# Patient Record
Sex: Male | Born: 1985 | Hispanic: Yes | Marital: Married
Health system: Southern US, Community
[De-identification: ages and names within clinical notes are randomized; demographics above are authoritative.]

---

## 2019-06-04 ENCOUNTER — Other Ambulatory Visit: Payer: Self-pay

## 2019-06-04 DIAGNOSIS — Z20822 Contact with and (suspected) exposure to covid-19: Secondary | ICD-10-CM

## 2019-06-05 LAB — NOVEL CORONAVIRUS, NAA: SARS-CoV-2, NAA: DETECTED — AB

## 2019-06-13 ENCOUNTER — Telehealth: Payer: Self-pay

## 2019-06-13 NOTE — Telephone Encounter (Signed)
Per pt. Request, faxed COVID 19 test results to his lawyer: [employer]; fax # [phone].

## 2019-06-13 NOTE — Telephone Encounter (Signed)
Patient called back and said that he needs his covid test send to fax # [phone].

## 2019-06-13 NOTE — Telephone Encounter (Signed)
Made 3 attempts to fax pt's COVID results to [place] @ [phone].  Call placed to Law office to verify the fax number is correct.  Per Secretary, the fax number is correct.  Noted that pt. Returned call to request his COVID test be sent to a different # ; [phone].  Phone call to pt. To inquire about the name of business that this is to be faxed to.  Pt. Stated that it is another number at the law office of [place].    Fax was successful @ [phone] @ 10:45 AM.  Pt. Was made aware per phone.

## 2019-10-01 DIAGNOSIS — F1510 Other stimulant abuse, uncomplicated: Secondary | ICD-10-CM | POA: Insufficient documentation

## 2019-10-01 DIAGNOSIS — R002 Palpitations: Secondary | ICD-10-CM | POA: Insufficient documentation

## 2019-10-02 ENCOUNTER — Other Ambulatory Visit: Payer: Self-pay

## 2019-10-02 ENCOUNTER — Emergency Department (HOSPITAL_COMMUNITY)
Admission: EM | Admit: 2019-10-02 | Discharge: 2019-10-02 | Disposition: A | Discharge status: Home / Self Care | Payer: Self-pay | Attending: Emergency Medicine | Admitting: Emergency Medicine

## 2019-10-02 ENCOUNTER — Telehealth: Payer: Self-pay

## 2019-10-02 ENCOUNTER — Emergency Department (HOSPITAL_COMMUNITY): Payer: Self-pay

## 2019-10-02 DIAGNOSIS — R002 Palpitations: Secondary | ICD-10-CM

## 2019-10-02 DIAGNOSIS — F1510 Other stimulant abuse, uncomplicated: Secondary | ICD-10-CM

## 2019-10-02 LAB — BASIC METABOLIC PANEL
Anion gap: 12 (ref 5–15)
BUN: 19 mg/dL (ref 6–20)
CO2: 24 mmol/L (ref 22–32)
Calcium: 9.9 mg/dL (ref 8.9–10.3)
Chloride: 100 mmol/L (ref 98–111)
Creatinine, Ser: 1.11 mg/dL (ref 0.61–1.24)
GFR calc Af Amer: >60 mL/min (ref >60)
GFR calc non Af Amer: >60 mL/min (ref >60)
Glucose, Bld: 112 mg/dL — ABNORMAL HIGH (ref 70–99)
Potassium: 3.9 mmol/L (ref 3.5–5.1)
Sodium: 136 mmol/L (ref 135–145)

## 2019-10-02 LAB — TROPONIN I (HIGH SENSITIVITY)
Troponin I (High Sensitivity): 5 ng/L (ref <18)
Troponin I (High Sensitivity): 6 ng/L (ref <18)

## 2019-10-02 LAB — CBC
HCT: 50.4 % (ref 39.0–52.0)
Hemoglobin: 17.3 g/dL — ABNORMAL HIGH (ref 13.0–17.0)
MCH: 29.5 pg (ref 26.0–34.0)
MCHC: 34.3 g/dL (ref 30.0–36.0)
MCV: 85.9 fL (ref 80.0–100.0)
Platelets: 419 10*3/uL — ABNORMAL HIGH (ref 150–400)
RBC: 5.87 MIL/uL — ABNORMAL HIGH (ref 4.22–5.81)
RDW: 12 % (ref 11.5–15.5)
WBC: 8.4 10*3/uL (ref 4.0–10.5)
nRBC: 0 % (ref 0.0–0.2)

## 2019-10-02 MED ORDER — LORAZEPAM 1 MG PO TABS
1.0000 mg | ORAL_TABLET | Freq: Once | ORAL | Status: AC
  Administered 2019-10-02: 1 mg via ORAL
  Filled 2019-10-02: qty 1

## 2019-10-02 NOTE — ED Triage Notes (Signed)
Pt states that he overdosed on meth last night and is having CP.

## 2019-10-02 NOTE — Discharge Instructions (Signed)
Your work-up in the emergency department today was reassuring.  We recommend that you discontinue use of illicit drugs.  You may follow-up with a treatment facility, if desired.  Return for any new or concerning symptoms.

## 2019-10-02 NOTE — Telephone Encounter (Signed)
Spoke to Pts sister via phone at [phone] to discuss avenues for detox. Sister informed CSW that Pt is currently at [facility] receiving detox services due to acute need.

## 2019-10-02 NOTE — ED Provider Notes (Signed)
[facility] EMERGENCY DEPARTMENT Provider Note   CSN: [id number] Arrival date & time: 10/01/19  2358     History Chief Complaint  Patient presents with  . Chest Pain    Duff Pozzi is a 34 y.o. male.  34 year old male presents to the emergency department for evaluation of chest discomfort.  Upon further inquiry into the patient's symptoms, it seems as though his complaint is more related to palpitations.  States that he felt his heart beating rapidly prior to arrival.  Has not been able to sleep for the past 48 hours.  Started a meth binge on Friday and thinks he may have overdosed on methamphetamines.  States that his palpitations have been spontaneously improving since arrival in the ED.  No fevers, syncope, vomiting, hemoptysis, leg swelling. Denies known FHx of sudden cardiac death. Does have remote hx of cocaine use, but has not used this x 1 month.  The history is provided by the patient. No language interpreter was used.  Chest Pain      No past medical history on file.  There are no problems to display for this patient.   ** The histories are not reviewed yet. Please review them in the "History" navigator section and refresh this SmartLink.     No family history on file.  Social History   Tobacco Use  . Smoking status: Not on file  Substance Use Topics  . Alcohol use: Not on file  . Drug use: Not on file    Home Medications Prior to Admission medications   Not on File    Allergies    Patient has no known allergies.  Review of Systems   Review of Systems  Cardiovascular: Positive for chest pain.  Ten systems reviewed and are negative for acute change, except as noted in the HPI.    Physical Exam Updated Vital Signs BP (!) 118/93   Pulse 96   Temp 98.1 F (36.7 C)   Resp 16   SpO2 100%   Physical Exam Vitals and nursing note reviewed.  Constitutional:      General: He is not in acute distress.    Appearance: He is  well-developed. He is not diaphoretic.     Comments: Nontoxic-appearing and in no distress  HENT:     Head: Normocephalic and atraumatic.  Eyes:     General: No scleral icterus.    Conjunctiva/sclera: Conjunctivae normal.  Cardiovascular:     Rate and Rhythm: Normal rate and regular rhythm.     Pulses: Normal pulses.  Pulmonary:     Effort: Pulmonary effort is normal. No respiratory distress.     Breath sounds: No stridor. No wheezing or rales.     Comments: Lungs clear to auscultation bilaterally.  Respirations even and unlabored. Musculoskeletal:        General: Normal range of motion.     Cervical back: Normal range of motion.  Skin:    General: Skin is warm and dry.     Coloration: Skin is not pale.     Findings: No erythema or rash.  Neurological:     Mental Status: He is alert and oriented to person, place, and time.  Psychiatric:        Mood and Affect: Mood is anxious.        Behavior: Behavior normal.     ED Results / Procedures / Treatments   Labs (all labs ordered are listed, but only abnormal results are displayed) Labs Reviewed  BASIC METABOLIC PANEL - Abnormal; Notable for the following components:      Result Value   Glucose, Bld 112 (*)    All other components within normal limits  CBC - Abnormal; Notable for the following components:   RBC 5.87 (*)    Hemoglobin 17.3 (*)    Platelets 419 (*)    All other components within normal limits  TROPONIN I (HIGH SENSITIVITY)  TROPONIN I (HIGH SENSITIVITY)    EKG EKG Interpretation  Date/Time:  Tuesday October 02 2019 00:03:05 EST Ventricular Rate:  98 PR Interval:  130 QRS Duration: 72 QT Interval:  322 QTC Calculation: 411 R Axis:   78 Text Interpretation: Normal sinus rhythm Biatrial enlargement Nonspecific T wave abnormality Abnormal ECG No old tracing to compare Confirmed by Deno Etienne [phone]) on 10/02/2019 4:08:03 AM   Radiology DG Chest 2 View  Result Date: 10/02/2019 CLINICAL DATA:  Chest  pain EXAM: CHEST - 2 VIEW COMPARISON:  None. FINDINGS: The heart size and mediastinal contours are within normal limits. Both lungs are clear. The visualized skeletal structures are unremarkable. IMPRESSION: No active cardiopulmonary disease. Electronically Signed   By: Ulyses Jarred M.D.   On: 10/02/2019 00:28    Procedures Procedures (including critical care time)  Medications Ordered in ED Medications  LORazepam (ATIVAN) tablet 1 mg (1 mg Oral Given 10/02/19 0455)    ED Course  I have reviewed the triage vital signs and the nursing notes.  Pertinent labs & imaging results that were available during my care of the patient were reviewed by me and considered in my medical decision making (see chart for details).    MDM Rules/Calculators/A&P                      34 year old male presents to the emergency department for evaluation of palpitations associated with methamphetamine use.  Has been on a meth binge since Friday.  Has also not been sleeping.  Had borderline tachycardia on arrival which has been spontaneously improving.  Patient also notes spontaneous cessation of his palpitations while in the waiting room.  His troponin is negative x2.  Chest x-ray without acute cardiopulmonary abnormality.  Nonspecific EKG changes, but no findings concerning for acute ischemia.  Suspect his symptoms to be secondary to illicit drug use.  Will provide resource guide should patient desire follow-up in an outpatient treatment facility.  Return precautions discussed and provided. Patient discharged in stable condition with no unaddressed concerns.   Final Clinical Impression(s) / ED Diagnoses Final diagnoses:  Palpitations  Methamphetamine abuse [facility])    Rx / DC Orders ED Discharge Orders    None       Antonietta Breach, PA-C 10/02/19 [place], [place], DO 10/02/19 [phone]

## 2019-10-03 ENCOUNTER — Ambulatory Visit (HOSPITAL_COMMUNITY)
Admission: RE | Admit: 2019-10-03 | Discharge: 2019-10-03 | Disposition: A | Discharge status: Home / Self Care | Payer: Self-pay | Attending: Psychiatry | Admitting: Psychiatry

## 2019-10-03 ENCOUNTER — Encounter (HOSPITAL_COMMUNITY): Payer: Self-pay | Admitting: Psychiatry

## 2019-10-03 DIAGNOSIS — Z7289 Other problems related to lifestyle: Secondary | ICD-10-CM | POA: Insufficient documentation

## 2019-10-03 NOTE — H&P (Signed)
Behavioral Health Medical Screening Exam  Patrick Page is an 34 y.o. male.  Patient presents voluntarily to [facility] behavioral health for walk-in assessment, patient accompanied by his sister.  Patient alert and oriented, answers appropriately.  Patient states he has been using alcohol and cocaine.  Patient reports he was clean "and sober for a long time but I recently started using again."  Patient reports he is here today because he would like resources for where he could get help with his substance use disorder as he is currently uninsured.  Patient denies suicidal and homicidal ideations.  Patient denies history of self-harm.  Patient denies auditory and visual hallucinations.  Patient denies history of seizures and delirium tremens. Patient reports he lives at home with his wife and sister.  Patient denies weapons in the home.  Patient reports he is employed in [employer].  Patient denies having outpatient psychiatrist, denies history of mental health diagnoses. Case discussed with Dr. Lucianne Muss.  Patient does not meet inpatient criteria at this time.  Total Time spent with patient: 30 minutes  Psychiatric Specialty Exam: Physical Exam  Nursing note and vitals reviewed. Constitutional: He is oriented to person, place, and time. He appears well-developed.  HENT:  Head: Normocephalic.  Cardiovascular: Normal rate.  Respiratory: Effort normal.  Neurological: He is alert and oriented to person, place, and time.  Psychiatric: He has a normal mood and affect. His behavior is normal. Judgment and thought content normal.    Review of Systems  Constitutional: Negative.   HENT: Negative.   Eyes: Negative.   Respiratory: Negative.   Cardiovascular: Negative.   Gastrointestinal: Negative.   Genitourinary: Negative.   Musculoskeletal: Negative.   Skin: Negative.   Neurological: Negative.     Blood pressure 137/77, pulse 92, temperature 98.2 F (36.8 C), temperature source Oral, resp. rate 18, SpO2  99 %.There is no height or weight on file to calculate BMI.  General Appearance: Casual and Fairly Groomed  Eye Contact:  Good  Speech:  Clear and Coherent and Normal Rate  Volume:  Normal  Mood:  Euthymic  Affect:  Appropriate and Congruent  Thought Process:  Coherent, Goal Directed and Descriptions of Associations: Intact  Orientation:  Full (Time, Place, and Person)  Thought Content:  WDL and Logical  Suicidal Thoughts:  No  Homicidal Thoughts:  No  Memory:  Immediate;   Good Recent;   Good Remote;   Good  Judgement:  Good  Insight:  Good  Psychomotor Activity:  Normal  Concentration: Concentration: Good and Attention Span: Good  Recall:  Good  Fund of Knowledge:Good  Language: Good  Akathisia:  No  Handed:  Right  AIMS (if indicated):     Assets:  Communication Skills Desire for Improvement Financial Resources/Insurance Housing Intimacy Leisure Time Physical Health Resilience Social Support Talents/Skills Transportation  Sleep:       Musculoskeletal: Strength & Muscle Tone: within normal limits Gait & Station: normal Patient leans: N/A  Blood pressure 137/77, pulse 92, temperature 98.2 F (36.8 C), temperature source Oral, resp. rate 18, SpO2 99 %.  Recommendations:  Based on my evaluation the patient does not appear to have an emergency medical condition.  Patrcia Dolly, FNP 10/03/2019, 1:39 PM

## 2019-10-03 NOTE — BH Assessment (Signed)
Assessment Note  Patrick Page is an 34 y.o. male who presented to [facility] as a walk-in seeking help for a drug problem.  Patient states that he has a history of cocaine use, but states that he has not used cocaine "in a long time."  He admits to drinking 3-4 beers each day of the weekend.  Patient states that he used methamphetamine over the weekend, 2-3 grams, and his wife is upset with him.  Patient states that he has been trying to get services for his use, but he has not been successful.  Patient states, "I don't want to be locked up anywhere."  He states that he is looking for an outpatient program that will provide him with counseling and drug tests.  Patient denies having any mental health history and states that he has never been suicidal, homicidal or psychotic.  He states that he has no history of abuse or self-mutilation.  He states that his sleep and appetite are good and he is able to contract for safety.  Patient presents as alert and oriented, his thoughts are organized and his memory is intact.  He does not appear to be responding to any internal stimuli.  His judgment, insight and impulse control are partially impaired due to his drug and alcohol use.   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   Family History: No family history on file.  Social History:  reports current alcohol use. He reports current drug use. Drug: Amphetamines. No history on file for tobacco.  Additional Social History:  Alcohol / Drug Use Pain Medications: see MAR Prescriptions: see MAR Over the Counter: see MAR History of alcohol / drug use?: Yes Longest period of sobriety (when/how long): UTA Negative Consequences of Use: Personal relationships Substance #1 Name of Substance 1: methamphetamine 1 - Age of First Use: 33 1 - Amount (size/oz): 2-3 grams 1 - Frequency: 1 time 1 - Duration: over the weekend 1 - Last Use / Amount: 3 days  ago Substance #2 Name of Substance 2: alcohol 2 - Age of First Use: UTA 2 - Amount (size/oz): 3-4 beers 2 - Frequency: 3 days week 2 - Duration: since onset 2 - Last Use / Amount: 3-4 beers  CIWA: CIWA-Ar BP: 137/77 Pulse Rate: 92 COWS:    Allergies: No Known Allergies  Home Medications: (Not in a hospital admission)   OB/GYN Status:  No LMP for male patient.  General Assessment Data Location of Assessment: [facility] Services TTS Assessment: In system Is this a Tele or Face-to-Face Assessment?: Face-to-Face Is this an Initial Assessment or a Re-assessment for this encounter?: Initial Assessment Patient Accompanied by:: Adult Permission Given to speak with another: No Language Other than English: No Living Arrangements: Other (Comment)(with wife) What gender do you identify as?: Male Marital status: Married Living Arrangements: Spouse/significant other Can pt return to current living arrangement?: Yes Admission Status: Voluntary Is patient capable of signing voluntary admission?: Yes Referral Source: Self/Family/Friend Insurance type: self-pay  Medical Screening Exam [facility] Walk-in ONLY) Medical Exam completed: Yes  Crisis Care Plan Living Arrangements: Spouse/significant other Legal Guardian: Other:(self) Name of Psychiatrist: none Name of Therapist: none  Education Status Is patient currently in school?: No Is the patient employed, unemployed or receiving disability?: Employed  Risk to self with the past 6 months Suicidal Ideation: No Has patient been a risk to self within the past 6 months prior to admission? : No Suicidal Intent: No  Has patient had any suicidal intent within the past 6 months prior to admission? : No Is patient at risk for suicide?: No Suicidal Plan?: No Has patient had any suicidal plan within the past 6 months prior to admission? : No Access to Means: No What has been your use of drugs/alcohol within the last 12 months?: meth and  alcohol Previous Attempts/Gestures: No How many times?: 0 Other Self Harm Risks: drug use Triggers for Past Attempts: None known Intentional Self Injurious Behavior: None Family Suicide History: No Recent stressful life event(s): Conflict (Comment)(with wife) Persecutory voices/beliefs?: No Depression: No Depression Symptoms: (none) Substance abuse history and/or treatment for substance abuse?: No Suicide prevention information given to non-admitted patients: Not applicable  Risk to Others within the past 6 months Homicidal Ideation: No Does patient have any lifetime risk of violence toward others beyond the six months prior to admission? : No Thoughts of Harm to Others: No Current Homicidal Intent: No Current Homicidal Plan: No Access to Homicidal Means: No Identified Victim: none History of harm to others?: No Assessment of Violence: None Noted Violent Behavior Description: none Does patient have access to weapons?: No Criminal Charges Pending?: No Does patient have a court date: No Is patient on probation?: No  Psychosis Hallucinations: None noted Delusions: None noted  Mental Status Report Appearance/Hygiene: Unremarkable Eye Contact: Good Motor Activity: Restlessness Speech: Unremarkable Level of Consciousness: Alert Mood: Pleasant Affect: Appropriate to circumstance Anxiety Level: Moderate Thought Processes: Coherent, Relevant Judgement: Partial Orientation: Person, Place, Time, Situation Obsessive Compulsive Thoughts/Behaviors: None  Cognitive Functioning Concentration: Normal Memory: Recent Intact, Remote Intact Is patient IDD: No Insight: Fair Impulse Control: Poor Appetite: Good Have you had any weight changes? : No Change Sleep: No Change Total Hours of Sleep: 8 Vegetative Symptoms: None  ADLScreening [facility] Assessment Services) Patient's cognitive ability adequate to safely complete daily activities?: Yes Patient able to express need for  assistance with ADLs?: Yes Independently performs ADLs?: Yes (appropriate for developmental age)  Prior Inpatient Therapy Prior Inpatient Therapy: No  Prior Outpatient Therapy Prior Outpatient Therapy: No Does patient have an ACCT team?: No Does patient have Intensive In-House Services?  : No Does patient have [facility] services? : No Does patient have P4CC services?: No  ADL Screening (condition at time of admission) Patient's cognitive ability adequate to safely complete daily activities?: Yes Is the patient deaf or have difficulty hearing?: No Does the patient have difficulty seeing, even when wearing glasses/contacts?: No Does the patient have difficulty concentrating, remembering, or making decisions?: No Patient able to express need for assistance with ADLs?: Yes Does the patient have difficulty dressing or bathing?: No Independently performs ADLs?: Yes (appropriate for developmental age) Does the patient have difficulty walking or climbing stairs?: No Weakness of Legs: None Weakness of Arms/Hands: None  Home Assistive Devices/Equipment Home Assistive Devices/Equipment: None  Therapy Consults (therapy consults require a physician order) PT Evaluation Needed: No OT Evalulation Needed: No SLP Evaluation Needed: No Abuse/Neglect Assessment (Assessment to be complete while patient is alone) Abuse/Neglect Assessment Can Be Completed: Yes Physical Abuse: Denies Verbal Abuse: Denies Sexual Abuse: Denies Exploitation of patient/patient's resources: Denies Values / Beliefs Cultural Requests During Hospitalization: None Spiritual Requests During Hospitalization: None Consults Spiritual Care Consult Needed: No Transition of Care Team Consult Needed: No Advance Directives (For Healthcare) Does Patient Have a Medical Advance Directive?: No Would patient like information on creating a medical advance directive?: No - Patient declined Nutrition Screen- [facility] Adult/[facility]/AP Has the  patient recently lost weight  without trying?: No Has the patient been eating poorly because of a decreased appetite?: No Malnutrition Screening Tool Score: 0        Disposition: Per Letitia Libra, NP, patient does not meet inpatient admission criteria and was given outpatient drug treatment services referrals to [facility] Disposition Initial Assessment Completed for this Encounter: Yes Disposition of Patient: Discharge Patient refused recommended treatment: No Mode of transportation if patient is discharged/movement?: Car Patient referred to: [facility]  On Site Evaluation by:   Reviewed with Physician:    Judeth Porch Nakyiah Kuck 10/03/2019 1:52 PM

## 2021-06-15 IMAGING — CR DG CHEST 2V
2 series · 2 of 2 positions shown · non-contrast
Comparison: None.

CLINICAL DATA: Chest pain

EXAM:
CHEST - 2 VIEW

[chest pa]
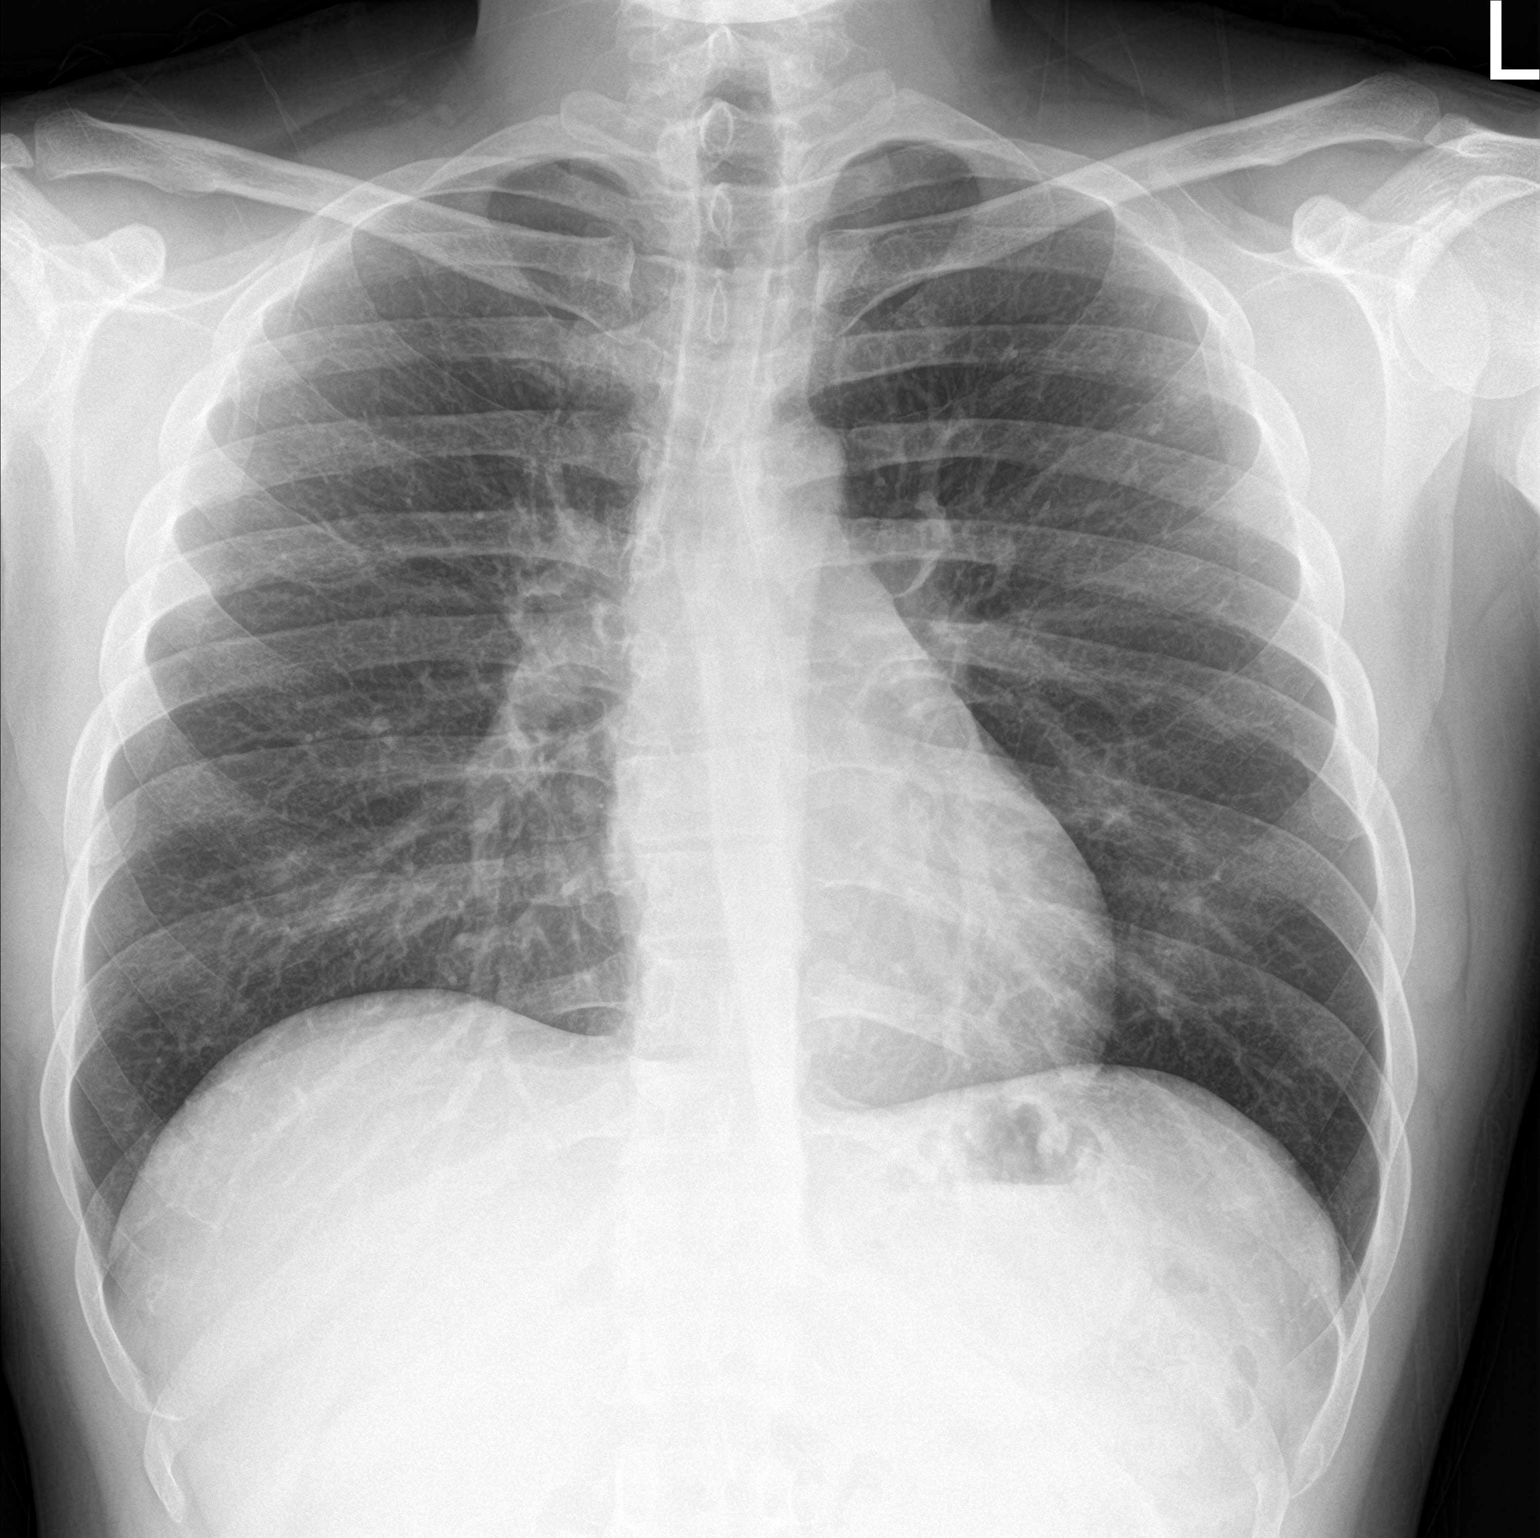

[chest lat]
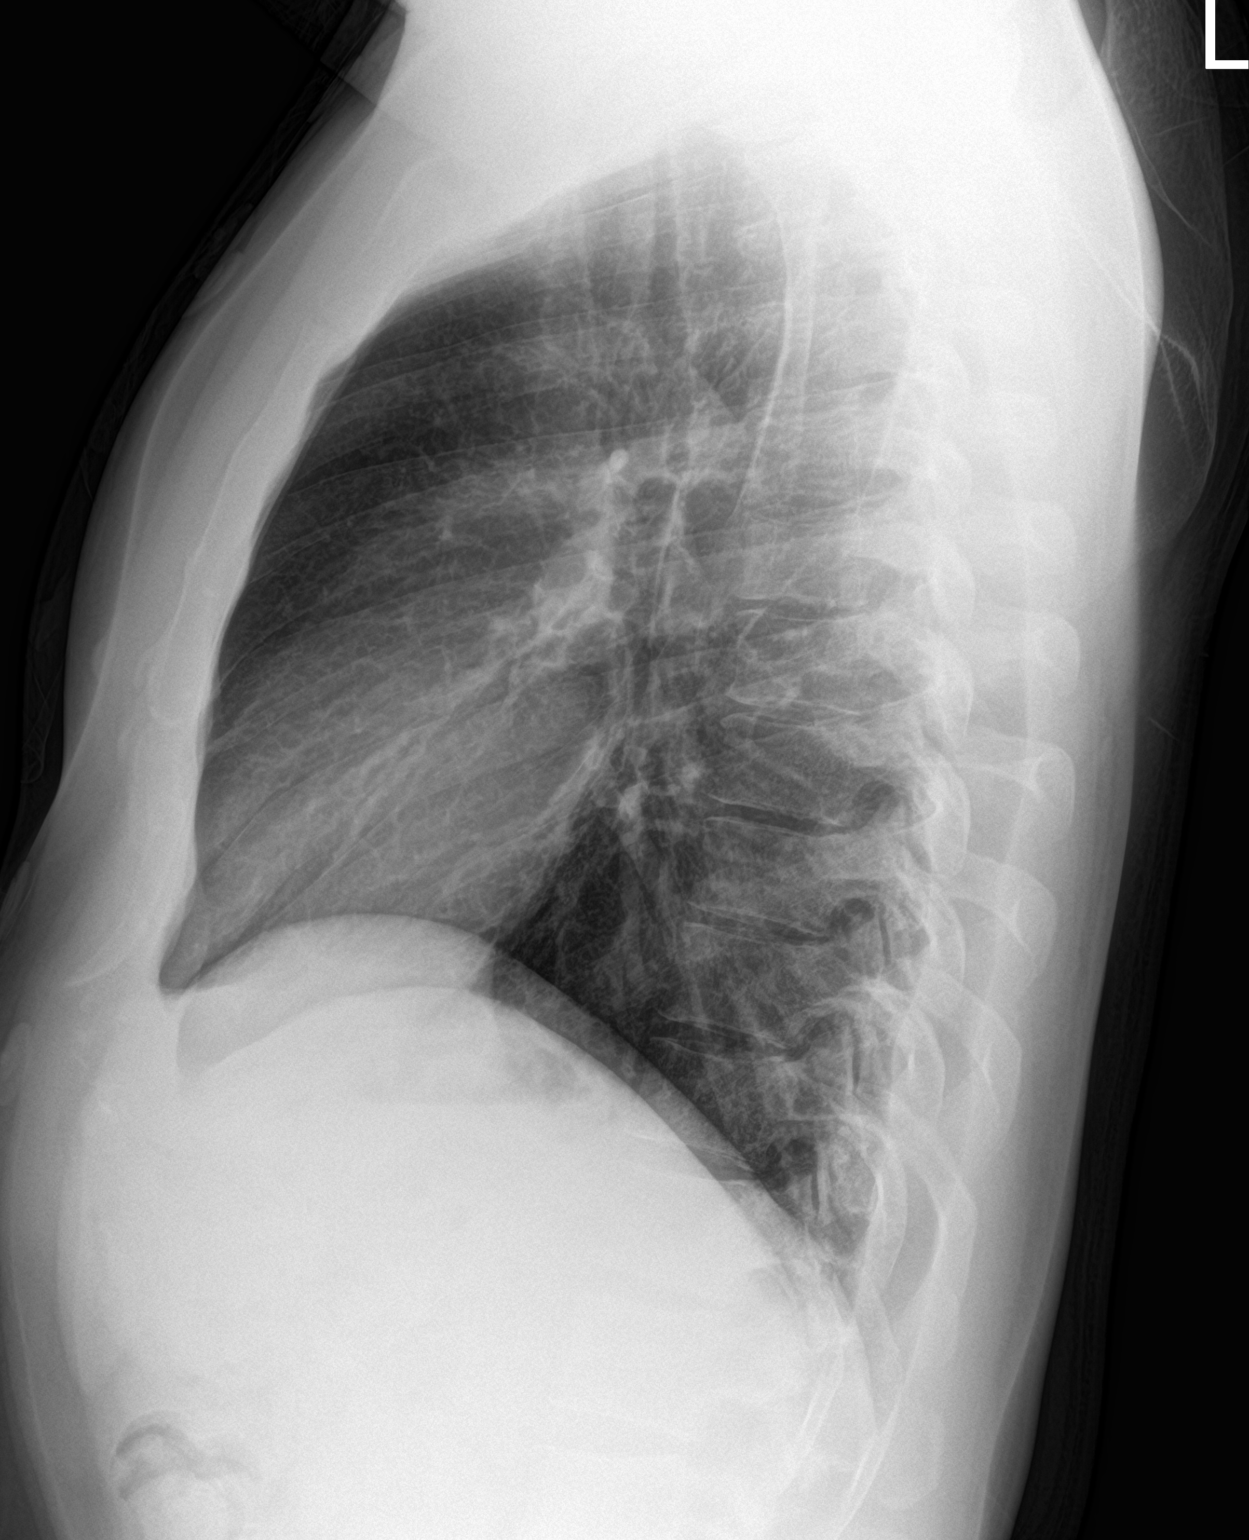

[2 of 2 positions shown; findings below may reference images not displayed]

FINDINGS: The heart size and mediastinal contours are within normal limits.
Both lungs are clear. The visualized skeletal structures are
unremarkable.
IMPRESSION: No active cardiopulmonary disease.

## 2024-01-02 ENCOUNTER — Emergency Department (HOSPITAL_BASED_OUTPATIENT_CLINIC_OR_DEPARTMENT_OTHER)
Admission: EM | Admit: 2024-01-02 | Discharge: 2024-01-03 | Disposition: A | Discharge status: Home / Self Care | Payer: Self-pay | Attending: Emergency Medicine | Admitting: Emergency Medicine

## 2024-01-02 ENCOUNTER — Encounter (HOSPITAL_BASED_OUTPATIENT_CLINIC_OR_DEPARTMENT_OTHER): Payer: Self-pay

## 2024-01-02 ENCOUNTER — Other Ambulatory Visit: Payer: Self-pay

## 2024-01-02 DIAGNOSIS — H6690 Otitis media, unspecified, unspecified ear: Secondary | ICD-10-CM

## 2024-01-02 DIAGNOSIS — H6691 Otitis media, unspecified, right ear: Secondary | ICD-10-CM | POA: Insufficient documentation

## 2024-01-02 MED ORDER — AMOXICILLIN 500 MG PO CAPS
1000.0000 mg | ORAL_CAPSULE | Freq: Once | ORAL | Status: AC
  Administered 2024-01-03: 1000 mg via ORAL
  Filled 2024-01-02: qty 2

## 2024-01-02 MED ORDER — OXYCODONE-ACETAMINOPHEN 5-325 MG PO TABS
2.0000 | ORAL_TABLET | Freq: Once | ORAL | Status: AC
  Administered 2024-01-03: 2 via ORAL
  Filled 2024-01-02: qty 2

## 2024-01-02 MED ORDER — AMOXICILLIN 500 MG PO CAPS: 1000.0000 mg | ORAL_CAPSULE | Freq: Two times a day (BID) | ORAL | 0 refills | Status: AC

## 2024-01-02 MED ORDER — OFLOXACIN 0.3 % OT SOLN: 3.0000 [drp] | Freq: Two times a day (BID) | OTIC | 0 refills | Status: AC

## 2024-01-02 NOTE — ED Triage Notes (Signed)
 Pt reports pain in right ear starting Friday morning with watery drainage intermittently/fullness/decreased hearing. No drainage noted in triage. Denies fever.

## 2024-01-02 NOTE — ED Provider Notes (Signed)
 [facility] Provider Note   CSN: [id number] Arrival date & time: 01/02/24  2124     History  Chief Complaint  Patient presents with   Ear Pain    Patrick Page is a 38 y.o. male.  Patient presents to the emergency department for evaluation of right ear pain for 4 days.  Patient reports that he has had prior problems with the ear, told that he had a hole in his eardrum.  Patient reports progressively worsening pain, decreased hearing.  He has not gotten relief with ibuprofen.       Home Medications Prior to Admission medications   Medication Sig Start Date End Date Taking? Authorizing Provider  amoxicillin  (AMOXIL ) 500 MG capsule Take 2 capsules (1,000 mg total) by mouth 2 (two) times daily. 01/02/24  Yes Harlee Eckroth, Marine Sia, MD  ofloxacin  (FLOXIN ) 0.3 % OTIC solution Place 3 drops into the right ear 2 (two) times daily for 7 days. 01/02/24 01/09/24 Yes Keneshia Tena, Marine Sia, MD      Allergies    Patient has no known allergies.    Review of Systems   Review of Systems  Physical Exam Updated Vital Signs BP (!) 126/92 (BP Location: Left Arm)   Pulse 63   Temp 98.2 F (36.8 C) (Oral)   Resp 18   Ht 5\' 7"  (1.702 m)   Wt 99.8 kg   SpO2 96%   BMI 34.46 kg/m  Physical Exam Vitals and nursing note reviewed.  Constitutional:      General: He is not in acute distress.    Appearance: He is well-developed.  HENT:     Head: Normocephalic and atraumatic.     Right Ear: Swelling present. Tympanic membrane is injected, scarred, erythematous and retracted.     Mouth/Throat:     Mouth: Mucous membranes are moist.  Eyes:     General: Vision grossly intact. Gaze aligned appropriately.     Extraocular Movements: Extraocular movements intact.     Conjunctiva/sclera: Conjunctivae normal.  Cardiovascular:     Rate and Rhythm: Normal rate and regular rhythm.     Pulses: Normal pulses.     Heart sounds: Normal heart sounds, S1 normal  and S2 normal. No murmur heard.    No friction rub. No gallop.  Pulmonary:     Effort: Pulmonary effort is normal. No respiratory distress.     Breath sounds: Normal breath sounds.  Abdominal:     Palpations: Abdomen is soft.     Tenderness: There is no abdominal tenderness. There is no guarding or rebound.     Hernia: No hernia is present.  Musculoskeletal:        General: No swelling.     Cervical back: Full passive range of motion without pain, normal range of motion and neck supple. No pain with movement, spinous process tenderness or muscular tenderness. Normal range of motion.     Right lower leg: No edema.     Left lower leg: No edema.  Skin:    General: Skin is warm and dry.     Capillary Refill: Capillary refill takes less than 2 seconds.     Findings: No ecchymosis, erythema, lesion or wound.  Neurological:     Mental Status: He is alert and oriented to person, place, and time.     GCS: GCS eye subscore is 4. GCS verbal subscore is 5. GCS motor subscore is 6.     Cranial Nerves: Cranial nerves 2-12  are intact.     Sensory: Sensation is intact.     Motor: Motor function is intact. No weakness or abnormal muscle tone.     Coordination: Coordination is intact.  Psychiatric:        Mood and Affect: Mood normal.        Speech: Speech normal.        Behavior: Behavior normal.     ED Results / Procedures / Treatments   Labs (all labs ordered are listed, but only abnormal results are displayed) Labs Reviewed - No data to display  EKG None  Radiology No results found.  Procedures Procedures    Medications Ordered in ED Medications  oxyCODONE -acetaminophen  (PERCOCET/ROXICET) 5-325 MG per tablet 2 tablet (has no administration in time range)  amoxicillin  (AMOXIL ) capsule 1,000 mg (has no administration in time range)    ED Course/ Medical Decision Making/ A&P                                 Medical Decision Making  Patient with right ear pain, reports prior  problems with the ear including infections and perforated TM.  External canal is slightly reddened but no significant inflammation or exudate.  Patient with dull, retracted, erythematous eardrum with prior scarring.  I do not see obvious perforation at this time.  Will treat with antibiotics, follow-up with ENT.        Final Clinical Impression(s) / ED Diagnoses Final diagnoses:  Acute otitis media, unspecified otitis media type    Rx / DC Orders ED Discharge Orders          Ordered    ofloxacin  (FLOXIN ) 0.3 % OTIC solution  2 times daily        01/02/24 2355    amoxicillin  (AMOXIL ) 500 MG capsule  2 times daily        01/02/24 2355              Ballard Bongo, MD 01/02/24 2356
